# Patient Record
Sex: Male | Born: 1975 | Hispanic: Yes | Marital: Married | Smoking: Never smoker
Health system: Southern US, Community
[De-identification: ages and names within clinical notes are randomized; demographics above are authoritative.]

---

## 2007-02-18 ENCOUNTER — Ambulatory Visit: Payer: Self-pay | Admitting: Family Medicine

## 2007-03-08 ENCOUNTER — Ambulatory Visit: Payer: Self-pay | Admitting: Family Medicine

## 2009-09-20 ENCOUNTER — Emergency Department: Payer: Self-pay | Admitting: Emergency Medicine

## 2011-09-19 IMAGING — CR DG CHEST 1V PORT
1 series · 1 of 1 positions shown · non-contrast
Comparison: none

REASON FOR EXAM: Chest Pain
COMMENTS:

PROCEDURE:     DXR - DXR PORTABLE CHEST SINGLE VIEW  - September 20, 2009 [DATE]
RESULT:     Comparison is made to the prior exam of 02/18/2007. The lung
fields are clear. No pneumonia, pneumothorax or pleural effusion is seen.
Heart size is normal. Monitoring electrodes are present.

[view not recorded]
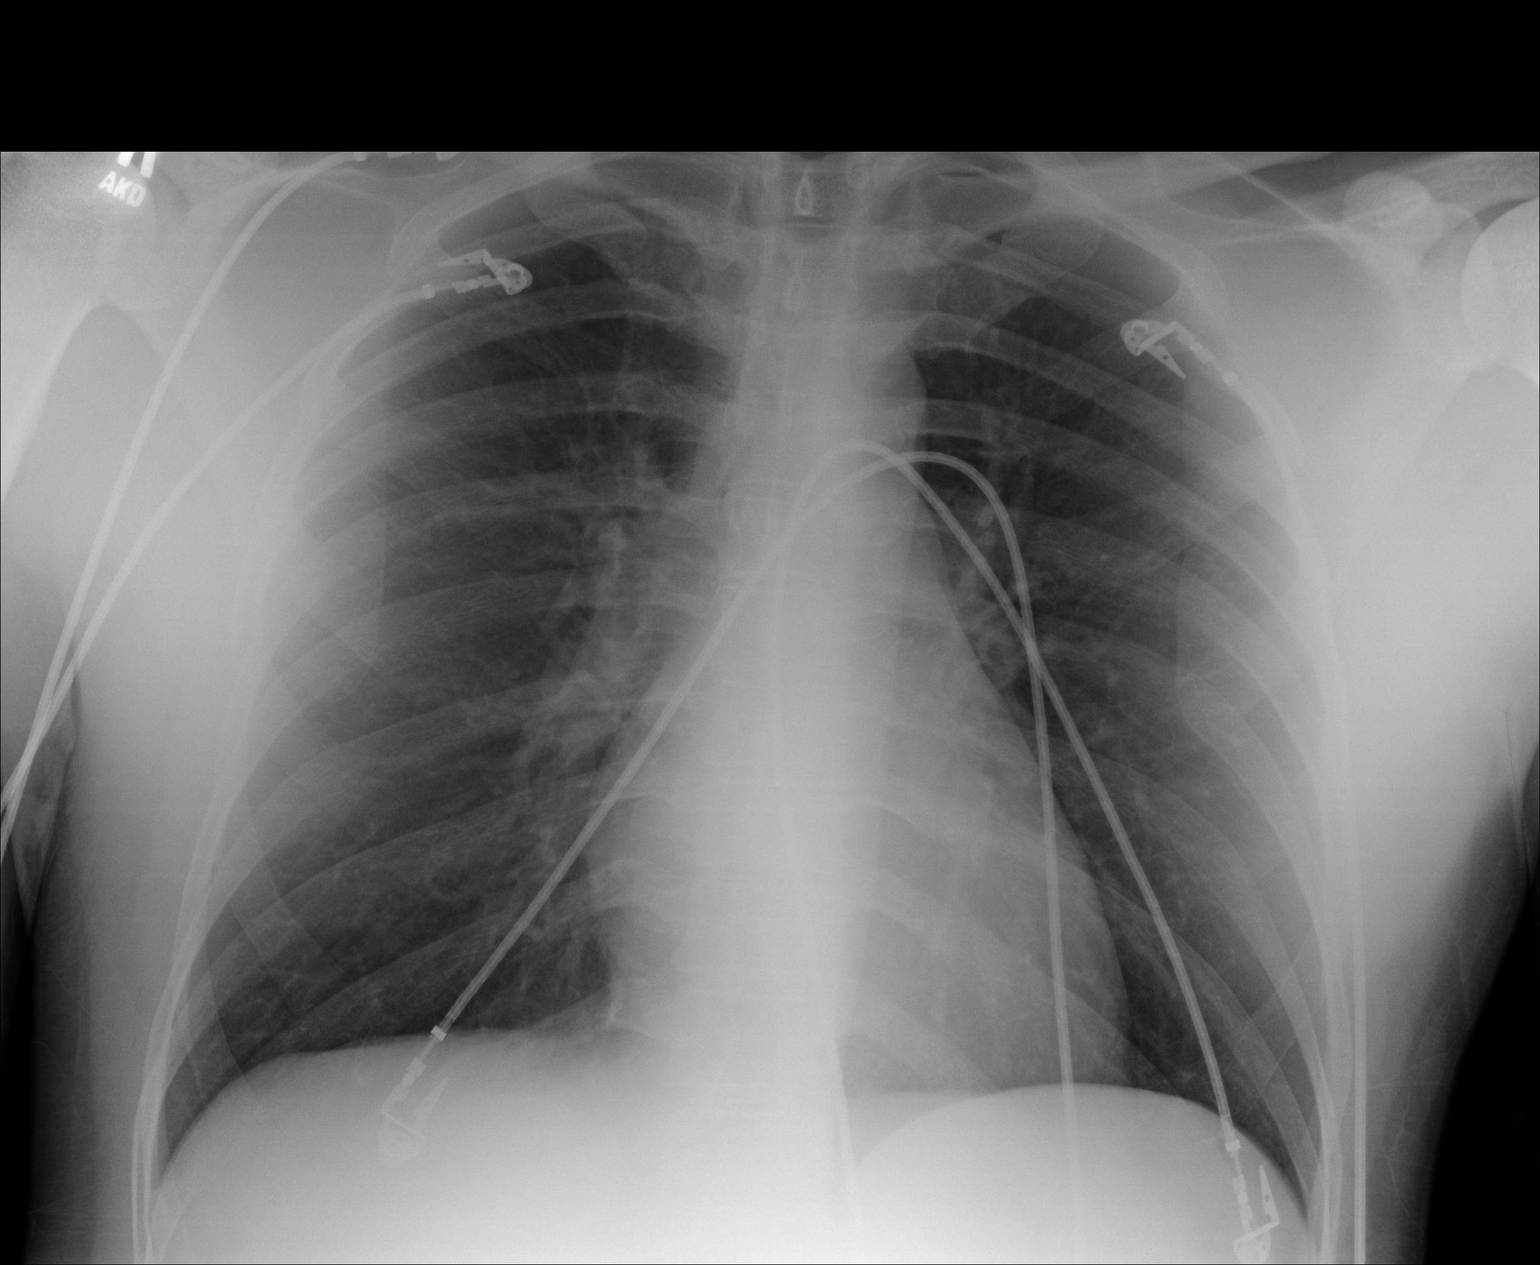

[1 of 1 positions shown; findings below may reference images not displayed]

IMPRESSION: No acute changes are identified.

## 2013-06-24 DIAGNOSIS — M545 Low back pain, unspecified: Secondary | ICD-10-CM | POA: Insufficient documentation

## 2013-06-25 DIAGNOSIS — Z0189 Encounter for other specified special examinations: Secondary | ICD-10-CM | POA: Insufficient documentation

## 2013-06-25 DIAGNOSIS — IMO0001 Reserved for inherently not codable concepts without codable children: Secondary | ICD-10-CM | POA: Insufficient documentation

## 2013-06-30 ENCOUNTER — Ambulatory Visit: Payer: Self-pay | Admitting: Gastroenterology

## 2015-03-19 ENCOUNTER — Encounter: Payer: Self-pay | Admitting: Emergency Medicine

## 2015-03-19 ENCOUNTER — Emergency Department
Admission: EM | Admit: 2015-03-19 | Discharge: 2015-03-19 | Disposition: A | Discharge status: Home / Self Care | Payer: Self-pay | Attending: Emergency Medicine | Admitting: Emergency Medicine

## 2015-03-19 DIAGNOSIS — R519 Headache, unspecified: Secondary | ICD-10-CM

## 2015-03-19 DIAGNOSIS — R51 Headache: Secondary | ICD-10-CM | POA: Insufficient documentation

## 2015-03-19 MED ORDER — KETOROLAC TROMETHAMINE 30 MG/ML IJ SOLN
30.0000 mg | Freq: Once | INTRAMUSCULAR | Status: AC
  Administered 2015-03-19: 30 mg via INTRAMUSCULAR
  Filled 2015-03-19: qty 1

## 2015-03-19 NOTE — Discharge Instructions (Signed)
Dolor de cabeza sinusal (Sinus Headache) El dolor de cabeza sinusal ocurre cuando los senos paranasales se taponan o se inflaman. Puede sentir dolor u opresin en el rostro, la frente, los odos o las piezas dentales superiores. Los dolores de cabeza sinusales pueden ser leves o intensos. CUIDADOS EN EL HOGAR  Tome los medicamentos solamente como se lo haya indicado el mdico.  Si le indicaron que tome antibiticos, debe terminarlos, incluso si comienza a sentirse mejor.  Aplquese un aerosol nasal si tiene la nariz taponada (congestin).  Si se lo indican, pngase un pao tibio y [employer] rostro para ayudar a Engineer, materials. SOLICITE AYUDA SI:  Tiene dolores de cabeza ms de una vez por semana.  La luz o el ruido [employer].  Tiene fiebre.  Tiene malestar estomacal (nuseas) o vomita.  Los dolores de cabeza no mejoran con Scientist, research (medical). SOLICITE AYUDA DE INMEDIATO SI:  Tiene dificultad para ver.  Repentinamente, siente un dolor muy intenso en el rostro o la cabeza.  Empieza a sacudirse o a temblar (convulsiones).  Se siente confundido.  Presenta rigidez en el cuello.   Esta informacin no tiene Theme park manager el consejo del mdico. Asegrese de hacerle al mdico cualquier pregunta que tenga.   Document Released: 05/23/2010 Document Revised: 05/05/2014 Elsevier Interactive Patient Education 2016 [employer].  Dolor de cabeza general sin causa (General Headache Without Cause) El dolor de cabeza es un dolor o Dentist que se siente en la zona de la cabeza o del cuello. Hay muchas causas y tipos de dolores de [place]. En algunos casos, es posible que no se encuentre la causa.  CUIDADOS EN EL HOGAR  Control del [employer] de venta libre y los recetados solamente como se lo haya indicado el mdico.  Cuando sienta dolor de cabeza acustese en un cuarto oscuro y tranquilo.  Si se lo indican, aplique hielo sobre la cabeza y la zona del  cuello:  Ponga el hielo en una bolsa plstica.  Coloque una toalla entre la piel y la bolsa de hielo.  Coloque el hielo durante , 2 a 3veces por Futures trader.  Utilice una almohadilla trmica o tome una ducha con agua caliente para aplicar calor en la cabeza y la zona del cuello como se lo haya indicado el mdico.  Mantenga las luces tenues si le [employer] luces brillantes o sus dolores de cabeza empeoran. Comida y bebida  Mantenga un horario para las comidas.  Beba menos alcohol.  Consuma menos o deje de tomar cafena. Instrucciones generales  Concurra a todas las visitas de control como se lo haya indicado el mdico. Esto es importante.  Lleve un registro diario para Financial risk analyst si ciertas cosas provocan los dolores de [place]. Por ejemplo, escriba los siguientes datos:  Lo que usted come y Estate agent.  Cunto tiempo duerme.  Algn cambio en su dieta o en los medicamentos.  Realice actividades relajantes, como recibir [place].  Disminuya el nivel de estrs.  Sintese con la espalda recta. No contraiga (tensione) los msculos.  No consuma productos que contengan tabaco. Estos incluyen cigarrillos, tabaco para mascar y Administrator, Civil Service. Si necesita ayuda para dejar de fumar, consulte al mdico.  Haga ejercicios con regularidad tal como se lo indic el mdico.  Duerma lo suficiente. Esto a menudo significa entre 7 y 9horas de sueo. SOLICITE AYUDA SI:  Los medicamentos no logran [employer].  Tiene un dolor de cabeza que es diferente a los otros dolores  de cabeza.  Tiene malestar estomacal (nuseas) o vomita.  Tiene fiebre. SOLICITE AYUDA DE INMEDIATO SI:   El dolor de [place] empeora.  Sigue vomitando.  Presenta rigidez en el cuello.  Tiene dificultad para ver.  Tiene dificultad para hablar.  Siente dolor en el ojo o en el odo.  Sus msculos estn dbiles, o pierde el control muscular.  Pierde el equilibrio o tiene problemas para  Advertising account plannercaminar.  Siente que se desvanece (pierde el conocimiento) o se desmaya.  Se siente confundido.   Esta informacin no tiene Theme park managercomo fin reemplazar el consejo del mdico. Asegrese de hacerle al mdico cualquier pregunta que tenga.   Document Released: 03/13/2011 Document Revised: 09/09/2014 Elsevier Interactive Patient Education [employer]2016 Elsevier Inc.

## 2015-03-19 NOTE — ED Notes (Signed)
Having sinus pressure and headache for about 1 week .took ASA this min relief this am  Denies any n/v or fever Isabelle Course/chills

## 2015-03-19 NOTE — ED Provider Notes (Signed)
[facility] Emergency Department Provider Note  ____________________________________________  Time seen: Approximately 12:56 PM  I have reviewed the triage [place] signs and the nursing notes.   HISTORY  Chief Complaint Sinusitis    HPI Avis Mcmahill is a 40 y.o. male , NAD, presents to the emergency department with frontal headache for about one week. Denies any upper respiratory symptoms including but not limited to nasal congestion, runny nose, sneezing, sinus pressure, sore throat, cough, chest congestion. Has not had any fever, chills, body aches. He had injuries or traumas. Notes he has had some blurred vision while out in the sun. Sensitive to light at this time. Denies any sensitivity to sound. Denies any loss of vision, floaters. No history of migraine headaches. Has taken over-the-counter Tylenol and aspirin with some relief but no resolution. Denies nausea, vomiting.   History reviewed. No pertinent past medical history.  There are no active problems to display for this patient.   History reviewed. No pertinent past surgical history.  No current outpatient prescriptions on file.  Allergies Review of patient's allergies indicates no known allergies.  No family history on file.  Social History Social History  Substance Use Topics  . Smoking status: Never Smoker   . Smokeless tobacco: None  . Alcohol Use: No     Review of Systems  Constitutional: No fever/chills, fatigue Eyes: Positive blurry vision in bright light. Positive photophobia. Negative phonophobia. No discharge, redness, swelling ENT: No sore throat, nasal congestion, runny nose, sneezing, ear pain. Cardiovascular: No chest pain. Respiratory: No cough. No shortness of breath. No wheezing.  Gastrointestinal: No abdominal pain.  No nausea, vomiting.   Musculoskeletal: Negative for back, neck pain.  Skin: Negative for rash. Neurological: Positive for headaches, but no focal weakness  or numbness. 10-point ROS otherwise negative.  ____________________________________________   PHYSICAL EXAM:  [place] SIGNS: ED Triage Vitals  Enc Vitals Group     BP 03/19/15 1246 114/58 mmHg     Pulse Rate 03/19/15 1246 88     Resp 03/19/15 1246 18     Temp 03/19/15 1246 98 F (36.7 C)     Temp Source 03/19/15 1246 Oral     SpO2 03/19/15 1246 98 %     Weight 03/19/15 1246 199 lb (90.266 kg)     Height 03/19/15 1246  (1.803 m)     Head Cir --      Peak Flow --      Pain Score 03/19/15 1246 8     Pain Loc --      Pain Edu? --      Excl. in GC? --     Constitutional: Alert and oriented. Well appearing and in no acute distress. Eyes: Conjunctivae are normal. PERRL. EOMI without pain.  Head: Atraumatic. ENT:      Ears: TMs visualized without erythema, bulging, effusion, perforation.      Nose: No congestion/rhinnorhea.      Mouth/Throat: Mucous membranes are moist. Pharynx without erythema, swelling, exudate. Neck: No cervical spine tenderness to palpation. Supple with full range of motion. Hematological/Lymphatic/Immunilogical: No cervical lymphadenopathy. Cardiovascular: Normal rate, regular rhythm. Normal S1 and S2.  Good peripheral circulation. Respiratory: Normal respiratory effort without tachypnea or retractions. Lungs CTAB. Neurologic:  Normal speech and language. No gross focal neurologic deficits are appreciated. CN III-XII grossly in tact Skin:  Skin is warm, dry and intact. No rash noted. Psychiatric: Mood and affect are normal. Speech and behavior are normal. Patient exhibits appropriate insight and  judgement.   ____________________________________________   LABS  None  ____________________________________________  EKG  None ____________________________________________  RADIOLOGY  None ____________________________________________    PROCEDURES  Procedure(s) performed: None    Medications  ketorolac (TORADOL) 30 MG/ML injection 30 mg  (30 mg Intramuscular Given 03/19/15 1334)   Patient notes improvement of headache and notes the pain is minimal.    ____________________________________________   INITIAL IMPRESSION / ASSESSMENT AND PLAN / ED COURSE  Patient's diagnosis is consistent with frontal headache. Patient will be discharged home with instructions that he may take over-the-counter Excedrin Migraine if headache recurs. [facility] if symptoms persist past this treatment course. Patient is given ED precautions to return to the ED for any worsening or new symptoms.    ____________________________________________  FINAL CLINICAL IMPRESSION(S) / ED DIAGNOSES  Final diagnoses:  Frontal headache      NEW MEDICATIONS STARTED DURING THIS VISIT:  New Prescriptions   No medications on file         Hope PigeonJami L Relda Agosto, PA-C 03/19/15 1412  Emily FilbertJonathan E Williams, MD 03/19/15 1430

## 2015-05-10 ENCOUNTER — Ambulatory Visit: Payer: Self-pay | Admitting: Family Medicine

## 2015-06-29 IMAGING — CT CT ABD-PELV W/ CM
2 of 4 series · 17 of 46 positions shown, 19 images · IV contrast (isovue)
Comparison: None.

CLINICAL DATA: Abdominal pain

EXAM:
CT ABDOMEN AND PELVIS WITH CONTRAST
TECHNIQUE: Multidetector CT imaging of the abdomen and pelvis was performed
using the standard protocol following bolus administration of
intravenous contrast. Oral contrast was also administered.
CONTRAST:  100 mL Isovue 370 nonionic

[Series 2: routine with · axial · 0.75mm/px · z∈[-1066,-666]mm · 14 of 88 slices shown, 16 images]
[im 4/88  soft-tissue]
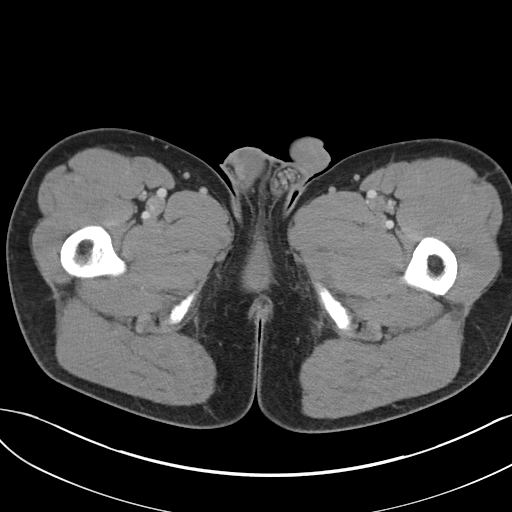
[im 4/88  bone]
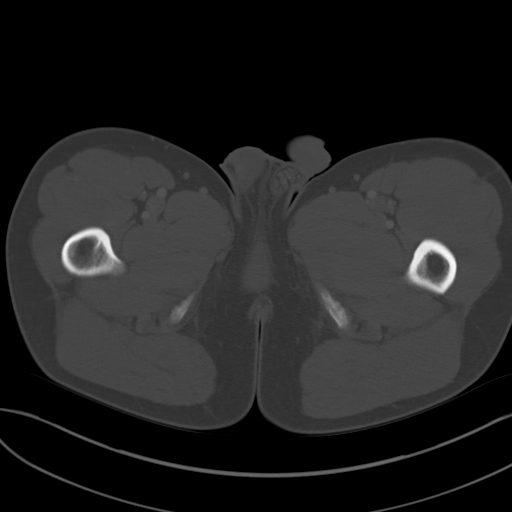
[im 11/88  soft-tissue]
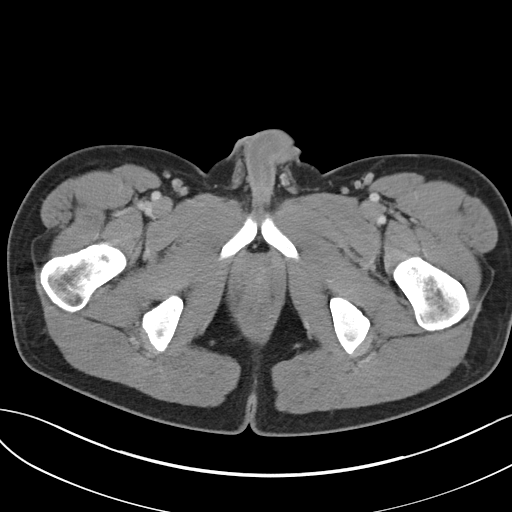
[im 17/88  soft-tissue]
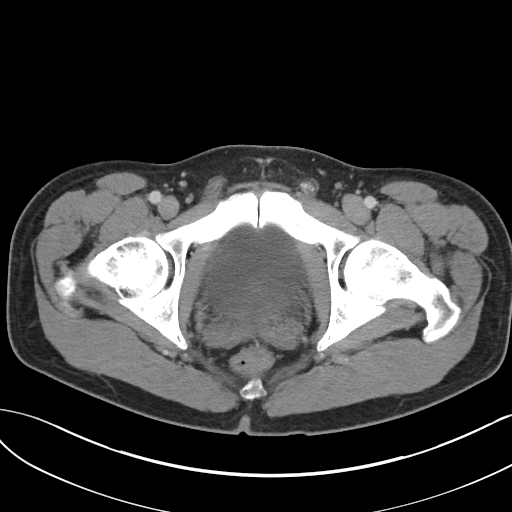
[im 24/88  soft-tissue]
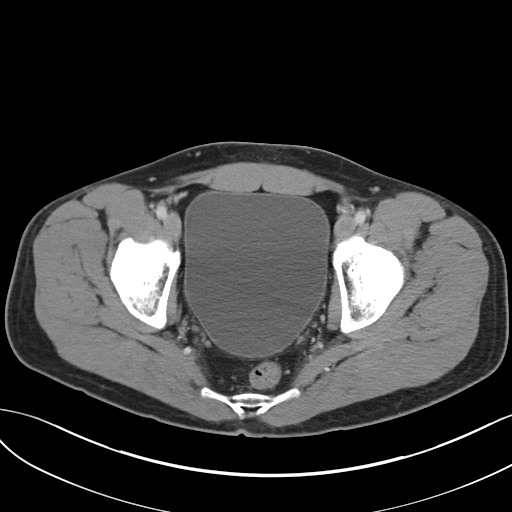
[im 31/88  soft-tissue]
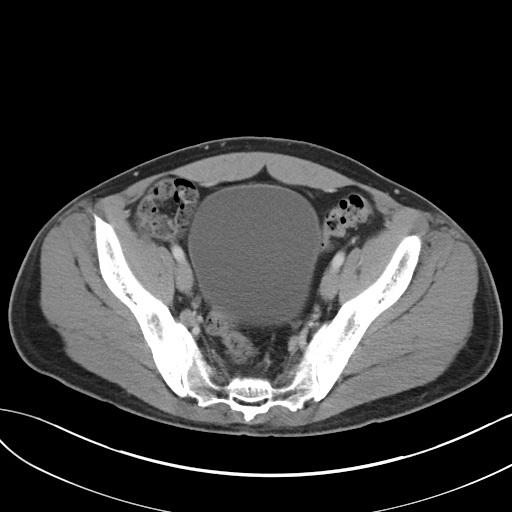
[im 34/88  soft-tissue]
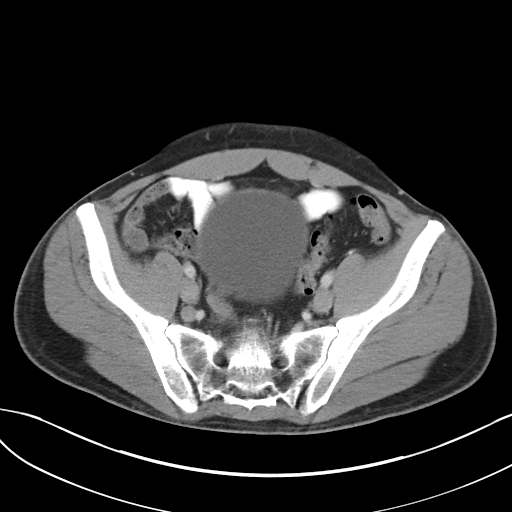
[im 41/88  soft-tissue]
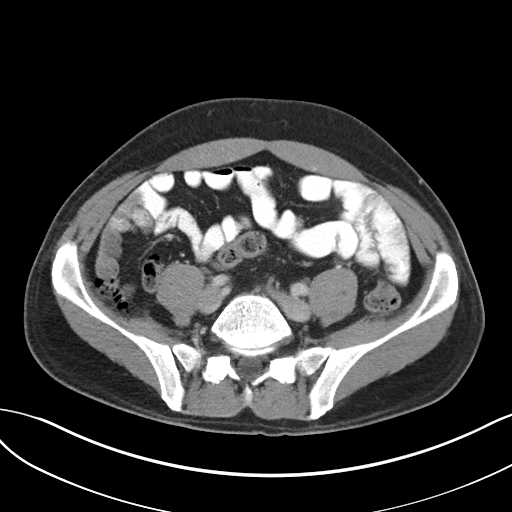
[im 47/88  soft-tissue]
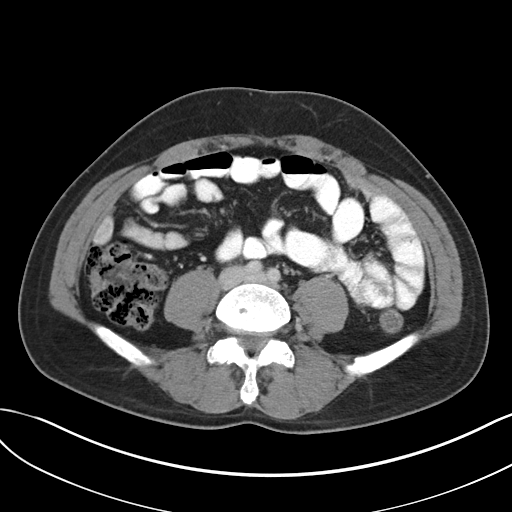
[im 54/88  soft-tissue]
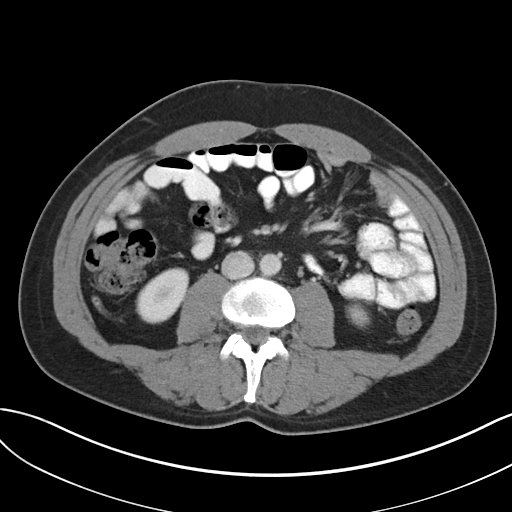
[im 54/88  bone]
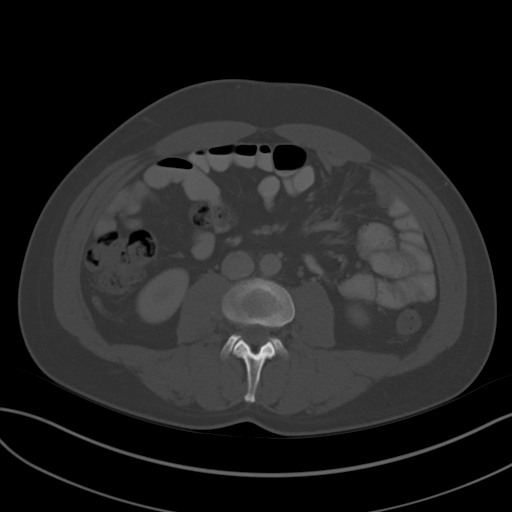
[im 57/88  soft-tissue]
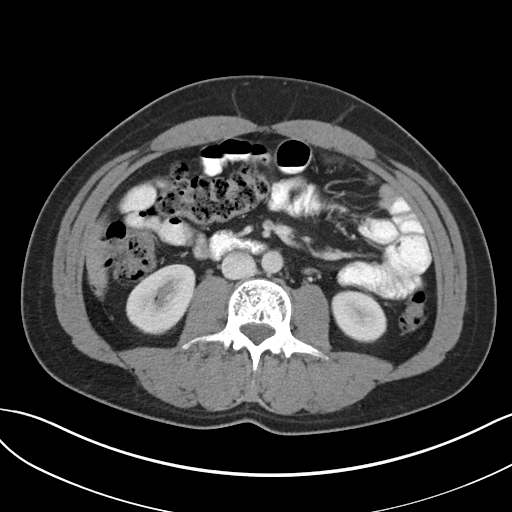
[im 64/88  soft-tissue]
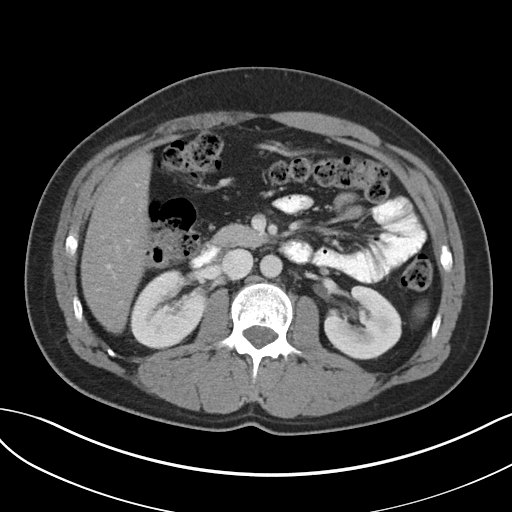
[im 71/88  soft-tissue]
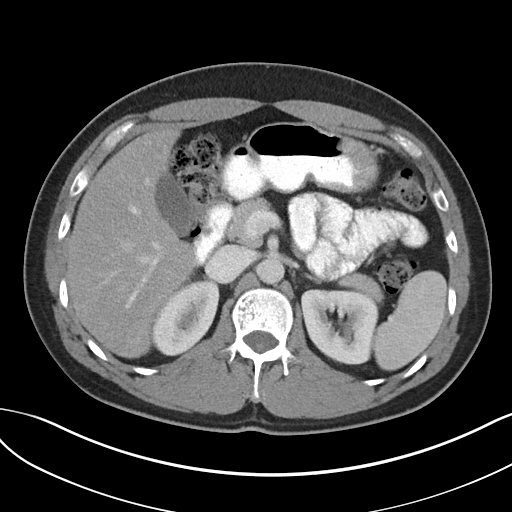
[im 77/88  soft-tissue]
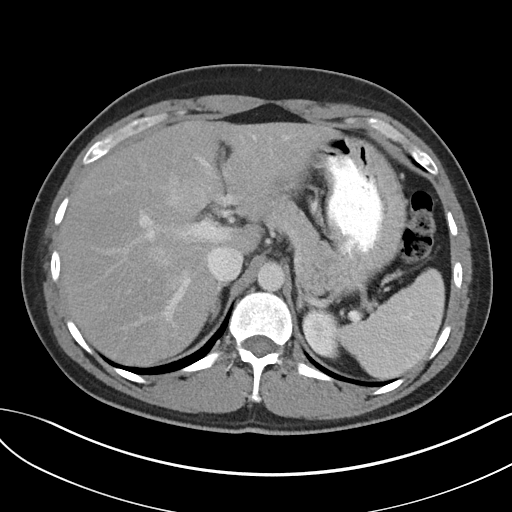
[im 84/88  soft-tissue]
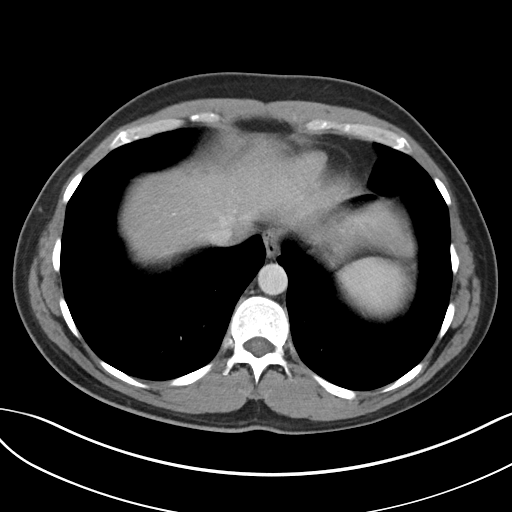

[Series 5: cor routine with · coronal · 0.81mm/px · 3 of 153 slices shown]
[im 51/153  soft-tissue]
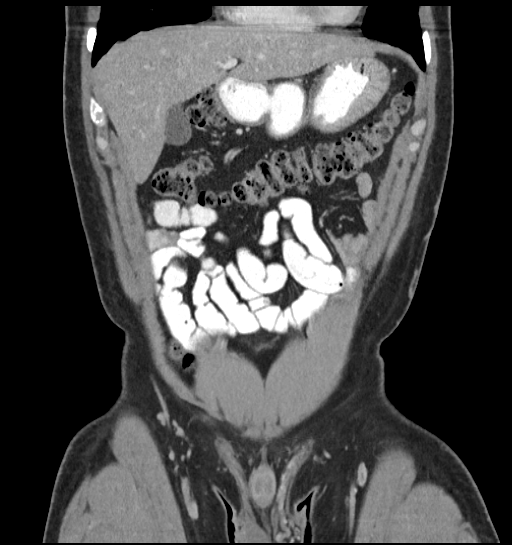
[im 68/153  soft-tissue]
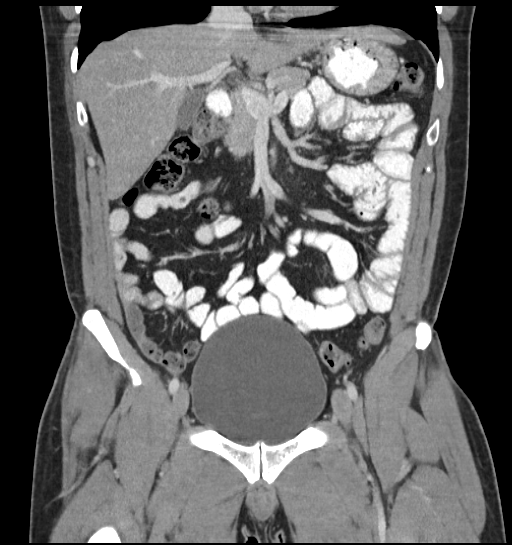
[im 85/153  soft-tissue]
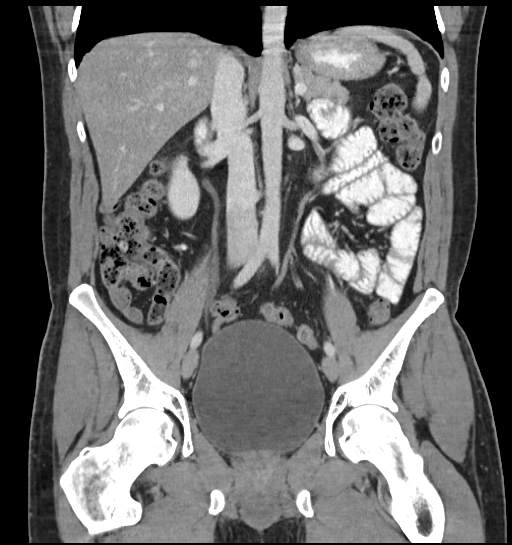

[17 of 46 positions shown; findings below may reference images not displayed]

FINDINGS: Lung bases are clear.

There is fatty change in the liver. There is a 7 mm cyst in the
anterior segment right lobe of the liver. No other focal liver
lesion is identified. Gallbladder wall is not appreciably thickened.
There is no biliary duct dilatation.

Spleen, pancreas, and adrenals appear normal. Kidneys bilaterally
show no evidence of mass or hydronephrosis on either side. There is
no renal or ureteral calculus on either side.

In the pelvis, the urinary bladder is midline with normal wall
thickness. There is no pelvic mass or fluid collection. The appendix
appears normal.

There is no appreciable bowel obstruction. No free air or portal
venous air.

There is no ascites, adenopathy, or abscess in the abdomen or
pelvis. There is no abdominal aortic aneurysm. There are multiple
small lucent areas throughout the lumbar spine of uncertain
etiology.
IMPRESSION: There is no bowel obstruction. No abscess. Appendix appears normal.

No renal or ureteral calculus.  No hydronephrosis.

Fatty liver.

Multiple subcentimeter lucent areas throughout the lumbar spine of
uncertain etiology. This finding may warrant pre and post-contrast
lumbar MR to further assess. Correlation with serum electrophoresis
may also be advisable, despite patient's young age.

## 2018-06-19 ENCOUNTER — Emergency Department
Admission: EM | Admit: 2018-06-19 | Discharge: 2018-06-19 | Disposition: A | Discharge status: Home / Self Care | Payer: Managed Care, Other (non HMO) | Attending: Emergency Medicine | Admitting: Emergency Medicine

## 2018-06-19 ENCOUNTER — Other Ambulatory Visit: Payer: Self-pay

## 2018-06-19 ENCOUNTER — Emergency Department: Payer: Managed Care, Other (non HMO)

## 2018-06-19 DIAGNOSIS — R0789 Other chest pain: Secondary | ICD-10-CM

## 2018-06-19 DIAGNOSIS — U071 COVID-19: Secondary | ICD-10-CM | POA: Insufficient documentation

## 2018-06-19 DIAGNOSIS — R079 Chest pain, unspecified: Secondary | ICD-10-CM | POA: Diagnosis present

## 2018-06-19 LAB — CBC WITH DIFFERENTIAL/PLATELET
Abs Immature Granulocytes: 0.01 10*3/uL (ref 0.00–0.07)
Basophils Absolute: 0 10*3/uL (ref 0.0–0.1)
Basophils Relative: 0 %
Eosinophils Absolute: 0.3 10*3/uL (ref 0.0–0.5)
Eosinophils Relative: 6 %
HCT: 45.5 % (ref 39.0–52.0)
Hemoglobin: 15.7 g/dL (ref 13.0–17.0)
Immature Granulocytes: 0 %
Lymphocytes Relative: 34 %
Lymphs Abs: 1.6 10*3/uL (ref 0.7–4.0)
MCH: 29.2 pg (ref 26.0–34.0)
MCHC: 34.5 g/dL (ref 30.0–36.0)
MCV: 84.6 fL (ref 80.0–100.0)
Monocytes Absolute: 0.6 10*3/uL (ref 0.1–1.0)
Monocytes Relative: 12 %
Neutro Abs: 2.3 10*3/uL (ref 1.7–7.7)
Neutrophils Relative %: 48 %
Platelets: 163 10*3/uL (ref 150–400)
RBC: 5.38 MIL/uL (ref 4.22–5.81)
RDW: 12.6 % (ref 11.5–15.5)
WBC: 4.8 10*3/uL (ref 4.0–10.5)
nRBC: 0 % (ref 0.0–0.2)

## 2018-06-19 LAB — BASIC METABOLIC PANEL
Anion gap: 12 (ref 5–15)
BUN: 13 mg/dL (ref 6–20)
CO2: 24 mmol/L (ref 22–32)
Calcium: 8.5 mg/dL — ABNORMAL LOW (ref 8.9–10.3)
Chloride: 103 mmol/L (ref 98–111)
Creatinine, Ser: 0.87 mg/dL (ref 0.61–1.24)
GFR calc Af Amer: >60 mL/min (ref >60)
GFR calc non Af Amer: >60 mL/min (ref >60)
Glucose, Bld: 96 mg/dL (ref 70–99)
Potassium: 3.8 mmol/L (ref 3.5–5.1)
Sodium: 139 mmol/L (ref 135–145)

## 2018-06-19 LAB — TROPONIN I: Troponin I: <0.03 ng/mL (ref <0.03)

## 2018-06-19 NOTE — Discharge Instructions (Addendum)
Return to the ER for new, worsening, or persistent severe pain, shortness of breath, weakness, confusion, or any other new or worsening symptoms that concern you.

## 2018-06-19 NOTE — ED Triage Notes (Signed)
Pt states he was called today from [facility] and told he is covid+, pt c/o having chest pain today with SOB. Pt is in NAD on arrival.

## 2018-06-19 NOTE — ED Provider Notes (Signed)
[facility] Emergency Department Provider Note ____________________________________________   First MD Initiated Contact with Patient 06/19/18 1154     (approximate)  I have reviewed the triage Cardin signs and the nursing notes.   HISTORY  Chief Complaint Chest Pain    HPI Mario Beasley is a 43 y.o. male with no significant PMH who presents with chest pain, left-sided, intermittent course, and described as pressure-like.  He states it is worse when he takes a deep breath.  The patient reports some mild intermittent nonproductive cough but denies active shortness of breath.  He has had some chills but no fever.  The patient initially got sick more than a week ago.  He was seen at [facility] a few days ago and was tested for COVID.  He was informed today that the test was positive.  History reviewed. No pertinent past medical history.  There are no active problems to display for this patient.   History reviewed. No pertinent surgical history.  Prior to Admission medications   Not on File    Allergies Patient has no known allergies.  No family history on file.  Social History Social History   Tobacco Use  . Smoking status: Never Smoker  . Smokeless tobacco: Never Used  Substance Use Topics  . Alcohol use: No  . Drug use: Not Currently    Review of Systems  Constitutional: No fever. Eyes: No redness. ENT: Positive for nasal congestion. Cardiovascular: Positive for chest pain. Respiratory: Denies shortness of breath. Gastrointestinal: No vomiting or diarrhea.  Genitourinary: Negative for flank pain.  Musculoskeletal: Negative for back pain. Skin: Negative for rash. Neurological: Negative for headache.   ____________________________________________   PHYSICAL EXAM:  [place] SIGNS: ED Triage Vitals  Enc Vitals Group     BP 06/19/18 1138 127/83     Pulse Rate 06/19/18 1138 63     Resp 06/19/18 1138 (!) 21     Temp 06/19/18 1138 (!)  97.4 F (36.3 C)     Temp Source 06/19/18 1138 Oral     SpO2 06/19/18 1138 99 %     Weight 06/19/18 1125 198 lb (89.8 kg)     Height 06/19/18 1125 5\' 10"  (1.778 m)     Head Circumference --      Peak Flow --      Pain Score 06/19/18 1125 5     Pain Loc --      Pain Edu? --      Excl. in GC? --     Constitutional: Alert and oriented. Well appearing and in no acute distress. Eyes: Conjunctivae are normal.  Head: Atraumatic. Nose: No congestion/rhinnorhea. Mouth/Throat: Mucous membranes are moist.   Neck: Normal range of motion.  Cardiovascular:  Good peripheral circulation. Respiratory: Normal respiratory effort.   Gastrointestinal: No distention.  Musculoskeletal: Extremities warm and well perfused.  Neurologic:  Normal speech and language. No gross focal neurologic deficits are appreciated.  Skin:  Skin is warm and dry. No rash noted. Psychiatric: Mood and affect are normal. Speech and behavior are normal.  ____________________________________________   LABS (all labs ordered are listed, but only abnormal results are displayed)  Labs Reviewed  BASIC METABOLIC PANEL - Abnormal; Notable for the following components:      Result Value   Calcium 8.5 (*)    All other components within normal limits  TROPONIN I  CBC WITH DIFFERENTIAL/PLATELET   ____________________________________________  EKG  ED ECG REPORT I, Dionne BucySebastian Eathon Valade, the attending physician, personally  viewed and interpreted this ECG.  Date: 06/19/2018 EKG Time: 1137 Rate: 64 Rhythm: normal sinus rhythm QRS Axis: normal Intervals: normal ST/T Wave abnormalities: normal Narrative Interpretation: no evidence of acute ischemia  ____________________________________________  RADIOLOGY  CXR: No focal infiltrate or other acute abnormality  ____________________________________________   PROCEDURES  Procedure(s) performed: No  Procedures  Critical Care performed: No  ____________________________________________   INITIAL IMPRESSION / ASSESSMENT AND PLAN / ED COURSE  Pertinent labs & imaging results that were available during my care of the patient were reviewed by me and considered in my medical decision making (see chart for details).  43 year old male with no significant past medical history presents primarily with intermittent atypical left-sided chest pain which is somewhat pleuritic.  He also has a mild nonproductive cough but denies shortness of breath.  He was tested for COVID-19 a few days ago and got a positive result today.  On exam the patient is well-appearing.  His Myung signs are normal.  The remainder of the exam is unremarkable.  He has no respiratory distress, hypoxia, or significant increased work of breathing.  Overall presentation is consistent with pleuritic pain related to COVID-19 versus muscular chest wall pain from coughing.  Given the patient stable Dom signs and well appearance I anticipate that he will be able to go home.  I do not suspect cardiac etiology.  There is no clinical evidence to suggest PE.  We will obtain EKG, chest x-ray, basic labs, and troponin.  Given the duration of the pain there is no indication for prolonged ED monitoring, observation or repeat labs.  ----------------------------------------- 1:25 PM on 06/19/2018 -----------------------------------------  Chest x-ray and lab work-up are within normal limits.  The Zimmers signs remained stable.  The patient continues to have no hypoxia or significant respiratory symptoms.  He is stable for discharge at this time.  I counseled him on the results of the work-up.  Return precautions given, and he expressed understanding.  ________________________________  Mario Beasley was evaluated in Emergency Department on 06/19/2018 for the symptoms described in the history of present illness. He was evaluated in the context of the global COVID-19 pandemic, which necessitated  consideration that the patient might be at risk for infection with the SARS-CoV-2 virus that causes COVID-19. Institutional protocols and algorithms that pertain to the evaluation of patients at risk for COVID-19 are in a state of rapid change based on information released by regulatory bodies including the CDC and federal and state organizations. These policies and algorithms were followed during the patient's care in the ED. ____________________________________________   FINAL CLINICAL IMPRESSION(S) / ED DIAGNOSES  Final diagnoses:  COVID-19  Atypical chest pain      NEW MEDICATIONS STARTED DURING THIS VISIT:  New Prescriptions   No medications on file     Note:  This document was prepared using Dragon voice recognition software and may include unintentional dictation errors.    Arta Silence, MD 06/19/18 1326

## 2019-06-05 ENCOUNTER — Ambulatory Visit: Payer: Managed Care, Other (non HMO) | Admitting: Physician Assistant

## 2019-07-11 ENCOUNTER — Ambulatory Visit: Payer: 59 | Admitting: Podiatry

## 2020-06-17 IMAGING — DX PORTABLE CHEST - 1 VIEW
1 series · 1 of 1 positions shown · non-contrast
Comparison: 09/20/2009

CLINICAL DATA: UNX2D-BS positive.  Chest pain short of breath.

EXAM:
PORTABLE CHEST 1 VIEW

[chest ap]
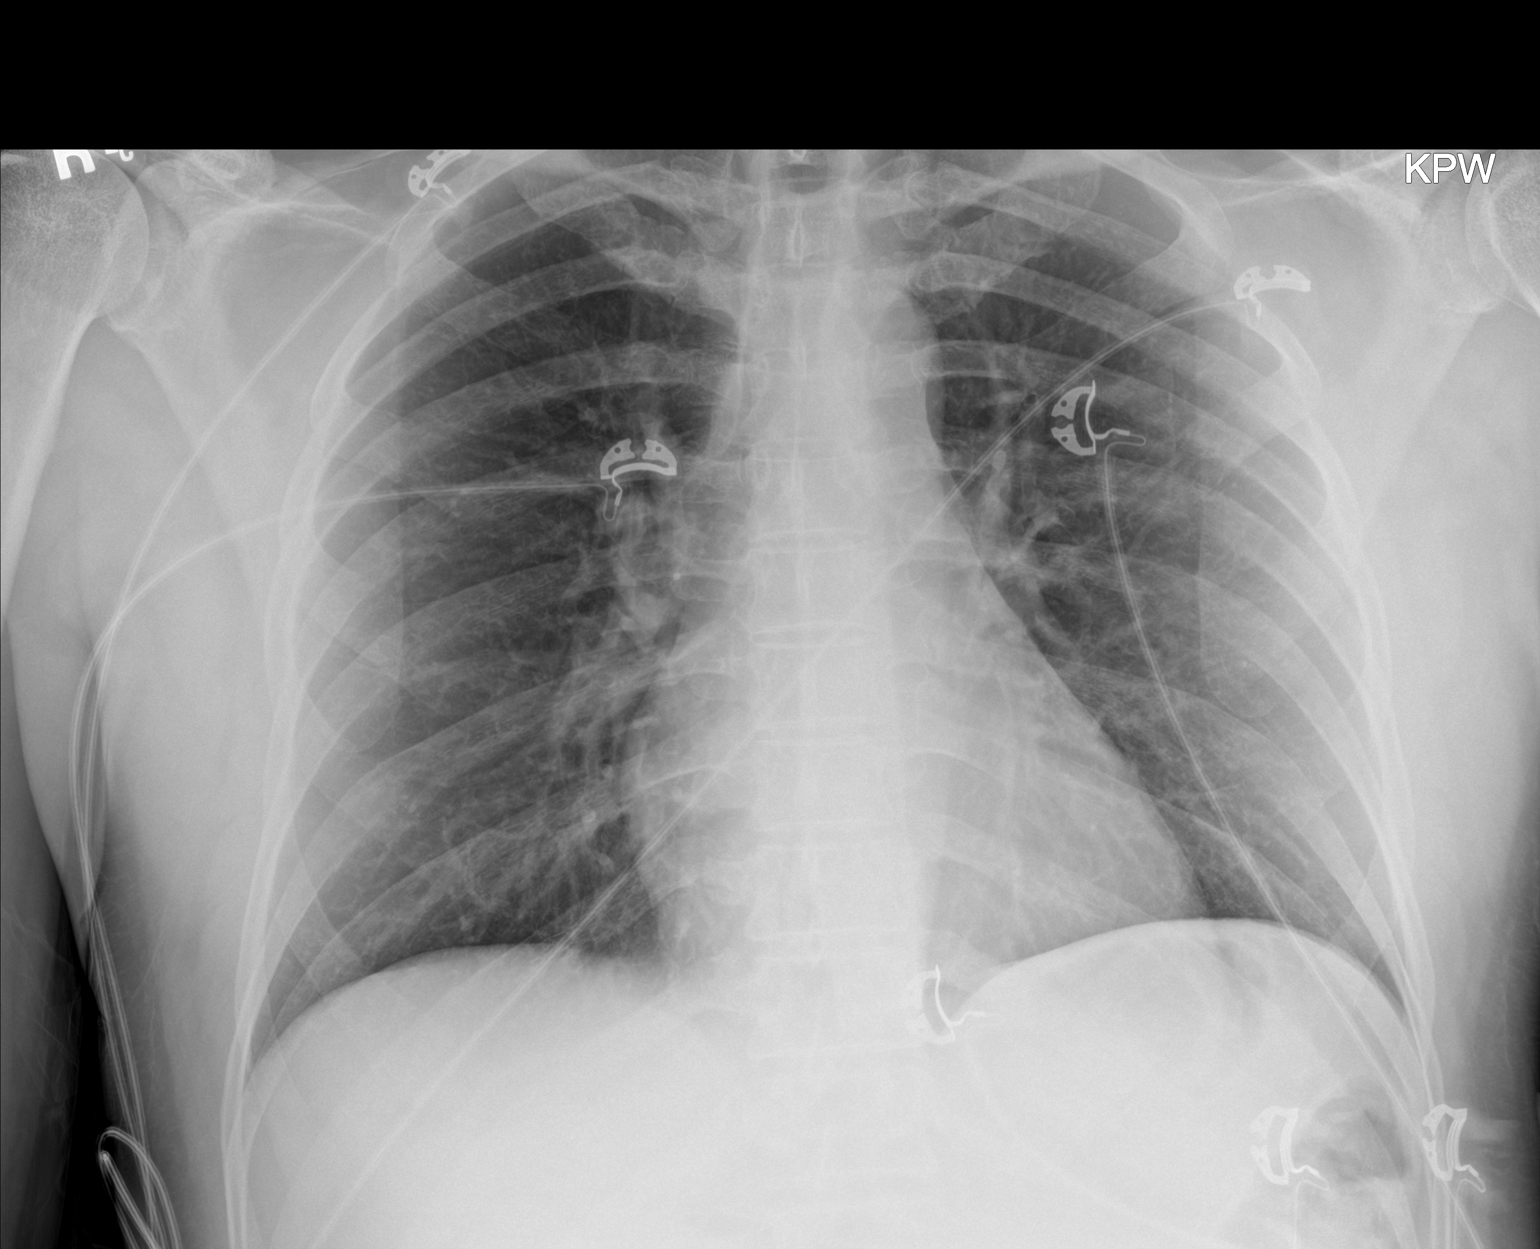

[1 of 1 positions shown; findings below may reference images not displayed]

FINDINGS: The heart size and mediastinal contours are within normal limits.
Both lungs are clear. The visualized skeletal structures are
unremarkable.
IMPRESSION: No active disease.

## 2023-09-09 NOTE — Patient Instructions (Incomplete)
 Healthy Eating, Adult Healthy eating may help you get and keep a healthy body weight, reduce the risk of chronic disease, and live a long and productive life. It is important to follow a healthy eating pattern. Your nutritional and calorie needs should be met mainly by different nutrient-rich foods. What are tips for following this plan? Reading food labels Read labels and choose the following: Reduced or low sodium products. Juices with 100% fruit juice. Foods with low saturated fats (<3 g per serving) and high polyunsaturated and monounsaturated fats. Foods with whole grains, such as whole wheat, cracked wheat, brown rice, and wild rice. Whole grains that are fortified with folic acid . This is recommended for females who are pregnant or who want to become pregnant. Read labels and do not eat or drink the following: Foods or drinks with added sugars. These include foods that contain brown sugar, corn sweetener, corn syrup, dextrose , fructose, glucose, high-fructose corn syrup, honey, invert sugar, lactose, malt syrup, maltose, molasses, raw sugar, sucrose, trehalose, or turbinado sugar. Limit your intake of added sugars to less than 10% of your total daily calories. Do not eat more than the following amounts of added sugar per day: 6 teaspoons (25 g) for females. 9 teaspoons (38 g) for males. Foods that contain processed or refined starches and grains. Refined grain products, such as white flour, degermed cornmeal, white bread, and white rice. Shopping Choose nutrient-rich snacks, such as vegetables, whole fruits, and nuts. Avoid high-calorie and high-sugar snacks, such as potato chips, fruit snacks, and candy. Use oil-based dressings and spreads on foods instead of solid fats such as butter, margarine, sour cream, or cream cheese. Limit pre-made sauces, mixes, and instant products such as flavored rice, instant noodles, and ready-made pasta. Try more plant-protein sources, such as tofu,  tempeh, black beans, edamame, lentils, nuts, and seeds. Explore eating plans such as the Mediterranean diet or vegetarian diet. Try heart-healthy dips made with beans and healthy fats like hummus and guacamole. Vegetables go great with these. Cooking Use oil to saut or stir-fry foods instead of solid fats such as butter, margarine, or lard. Try baking, boiling, grilling, or broiling instead of frying. Remove the fatty part of meats before cooking. Steam vegetables in water  or broth. Meal planning  At meals, imagine dividing your plate into fourths: One-half of your plate is fruits and vegetables. One-fourth of your plate is whole grains. One-fourth of your plate is protein, especially lean meats, poultry, eggs, tofu, beans, or nuts. Include low-fat dairy as part of your daily diet. Lifestyle Choose healthy options in all settings, including home, work, school, restaurants, or stores. Prepare your food safely: Wash your hands after handling raw meats. Where you prepare food, keep surfaces clean by regularly washing with hot, soapy water . Keep raw meats separate from ready-to-eat foods, such as fruits and vegetables. Cook seafood, meat, poultry, and eggs to the recommended temperature. Get a food thermometer. Store foods at safe temperatures. In general: Keep cold foods at 34F (4.4C) or below. Keep hot foods at 134F (60C) or above. Keep your freezer at [facility] (-17.8C) or below. Foods are not safe to eat if they have been between the temperatures of 40-134F (4.4-60C) for more than 2 hours. What foods should I eat? Fruits Aim to eat 1-2 cups of fresh, canned (in natural juice), or frozen fruits each day. One cup of fruit equals 1 small apple, 1 large banana, 8 large strawberries, 1 cup (237 g) canned fruit,  cup (82 g) dried fruit,  or 1 cup (240 mL) 100% juice. Vegetables Aim to eat 2-4 cups of fresh and frozen vegetables each day, including different varieties and colors. One cup  of vegetables equals 1 cup (91 g) broccoli or cauliflower florets, 2 medium carrots, 2 cups (150 g) raw, leafy greens, 1 large tomato, 1 large bell pepper, 1 large sweet potato, or 1 medium white potato. Grains Aim to eat 5-10 ounce-equivalents of whole grains each day. Examples of 1 ounce-equivalent of grains include 1 slice of bread, 1 cup (40 g) ready-to-eat cereal, 3 cups (24 g) popcorn, or  cup (93 g) cooked rice. Meats and other proteins Try to eat 5-7 ounce-equivalents of protein each day. Examples of 1 ounce-equivalent of protein include 1 egg,  oz nuts (12 almonds, 24 pistachios, or 7 walnut halves), 1/4 cup (90 g) cooked beans, 6 tablespoons (90 g) hummus or 1 tablespoon (16 g) peanut butter. A cut of meat or fish that is the size of a deck of cards is about 3-4 ounce-equivalents (85 g). Of the protein you eat each week, try to have at least 8 sounce (227 g) of seafood. This is about 2 servings per week. This includes salmon, trout, herring, sardines, and anchovies. Dairy Aim to eat 3 cup-equivalents of fat-free or low-fat dairy each day. Examples of 1 cup-equivalent of dairy include 1 cup (240 mL) milk, 8 ounces (250 g) yogurt, 1 ounces (44 g) natural cheese, or 1 cup (240 mL) fortified soy milk. Fats and oils Aim for about 5 teaspoons (21 g) of fats and oils per day. Choose monounsaturated fats, such as canola and olive oils, mayonnaise made with olive oil or avocado oil, avocados, peanut butter, and most nuts, or polyunsaturated fats, such as sunflower, corn, and soybean oils, walnuts, pine nuts, sesame seeds, sunflower seeds, and flaxseed. Beverages Aim for 6 eight-ounce glasses of water  per day. Limit coffee to 3-5 eight-ounce cups per day. Limit caffeinated beverages that have added calories, such as soda and energy drinks. If you drink alcohol: Limit how much you have to: 0-1 drink a day if you are male. 0-2 drinks a day if you are male. Know how much alcohol is in your drink.  In the [place] one drink is one 12 oz bottle of beer (355 mL), one 5 oz glass of wine (148 mL), or one 1 oz glass of hard liquor (44 mL). Seasoning and other foods Try not to add too much salt to your food. Try using herbs and spices instead of salt. Try not to add sugar to food. This information is based on [place] nutrition guidelines. To learn more, visit [employer]. Exact amounts may vary. You may need different amounts. This information is not intended to replace advice given to you by your health care provider. Make sure you discuss any questions you have with your health care provider. Document Revised: 09/19/2021 Document Reviewed: 09/19/2021 [employer] Patient Education  2024 [employer].

## 2023-09-14 ENCOUNTER — Ambulatory Visit: Admitting: Nurse Practitioner
